# Patient Record
Sex: Male | Born: 1985 | Race: White | Hispanic: No | Marital: Married
Health system: Southern US, Community
[De-identification: ages and names within clinical notes are randomized; demographics above are authoritative.]

---

## 2019-12-17 ENCOUNTER — Other Ambulatory Visit: Payer: Self-pay

## 2019-12-17 ENCOUNTER — Emergency Department (HOSPITAL_COMMUNITY): Payer: BLUE CROSS/BLUE SHIELD

## 2019-12-17 ENCOUNTER — Encounter (HOSPITAL_COMMUNITY): Payer: Self-pay | Admitting: Emergency Medicine

## 2019-12-17 ENCOUNTER — Emergency Department (HOSPITAL_COMMUNITY)
Admission: EM | Admit: 2019-12-17 | Discharge: 2019-12-17 | Disposition: A | Discharge status: Home / Self Care | Payer: BLUE CROSS/BLUE SHIELD | Attending: Emergency Medicine | Admitting: Emergency Medicine

## 2019-12-17 DIAGNOSIS — R0602 Shortness of breath: Secondary | ICD-10-CM | POA: Insufficient documentation

## 2019-12-17 DIAGNOSIS — R079 Chest pain, unspecified: Secondary | ICD-10-CM | POA: Insufficient documentation

## 2019-12-17 DIAGNOSIS — F419 Anxiety disorder, unspecified: Secondary | ICD-10-CM | POA: Diagnosis not present

## 2019-12-17 LAB — BASIC METABOLIC PANEL
Anion gap: 13 (ref 5–15)
BUN: 11 mg/dL (ref 6–20)
CO2: 24 mmol/L (ref 22–32)
Calcium: 9.3 mg/dL (ref 8.9–10.3)
Chloride: 100 mmol/L (ref 98–111)
Creatinine, Ser: 0.97 mg/dL (ref 0.61–1.24)
GFR calc Af Amer: >60 mL/min (ref >60)
GFR calc non Af Amer: >60 mL/min (ref >60)
Glucose, Bld: 107 mg/dL — ABNORMAL HIGH (ref 70–99)
Potassium: 4.2 mmol/L (ref 3.5–5.1)
Sodium: 137 mmol/L (ref 135–145)

## 2019-12-17 LAB — CBC
HCT: 48.1 % (ref 39.0–52.0)
Hemoglobin: 16.1 g/dL (ref 13.0–17.0)
MCH: 30.6 pg (ref 26.0–34.0)
MCHC: 33.5 g/dL (ref 30.0–36.0)
MCV: 91.4 fL (ref 80.0–100.0)
Platelets: 180 10*3/uL (ref 150–400)
RBC: 5.26 MIL/uL (ref 4.22–5.81)
RDW: 12.4 % (ref 11.5–15.5)
WBC: 7.9 10*3/uL (ref 4.0–10.5)
nRBC: 0 % (ref 0.0–0.2)

## 2019-12-17 LAB — TROPONIN I (HIGH SENSITIVITY)
Troponin I (High Sensitivity): 3 ng/L (ref <18)
Troponin I (High Sensitivity): 3 ng/L (ref <18)

## 2019-12-17 MED ORDER — SODIUM CHLORIDE 0.9% FLUSH: 3.0000 mL | Freq: Once | INTRAVENOUS | Status: DC

## 2019-12-17 NOTE — ED Provider Notes (Signed)
[facility] EMERGENCY DEPARTMENT Provider Note   CSN: [id number] Arrival date & time: 12/17/19  1223     History No chief complaint on file.   Henry Mack is a 34 y.o. 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  Patient states he was running around and began to feel chest discomfort.  Describes chest pain as a "weight on his chest".  Chest pain lasted 30 to 40 minutes associated with "faster breathing".  Patient states he was hot because he was in the kitchen.  Denies nausea and vomiting.  Denies recent illness.  Patient states he had a similar episode of chest pain roughly 10 years ago and was told by EMS that it was related to anxiety.  Patient states he was feeling slightly anxious prior to onset of chest pain.  Denies family history of early CAD.  Denies history of blood clots, recent surgeries, recent long immobilizations, and hormonal treatments.  Patient was given 324 ASA prior to arrival.  Patient denied sublingual nitroglycerin.  Patient is currently chest pain-free.  Denies relationship to exertion and change in position.  Denies recent illness.  History obtained from patient and past medical records. No interpreter used during encounter.      History reviewed. No pertinent past medical history.  There are no problems to display for this patient.   History reviewed. No pertinent surgical history.     No family history on file.  Social History   Tobacco Use  . Smoking status: Not on file  Substance Use Topics  . Alcohol use: Not on file  . Drug use: Not on file    Home Medications Prior to Admission medications   Not on File    Allergies    Clindamycin/lincomycin  Review of Systems   Review of Systems  Constitutional: Negative for chills and fever.  Respiratory: Positive for  shortness of breath (admits to breathing faster).   Cardiovascular: Positive for chest pain. Negative for leg swelling.  Gastrointestinal: Negative for abdominal pain, nausea and vomiting.  All other systems reviewed and are negative.   Physical Exam Updated Vital Signs BP (!) 140/92   Pulse 78   Temp 98.6 F (37 C) (Oral)   Resp 12   Ht 5\' 8"  (1.727 m)   Wt 73.5 kg   SpO2 99%   BMI 24.63 kg/m   Physical Exam Vitals and nursing note reviewed.  Constitutional:      General: He is not in acute distress.    Appearance: He is not ill-appearing.  HENT:     Head: Normocephalic.  Eyes:     Pupils: Pupils are equal, round, and reactive to light.  Cardiovascular:     Rate and Rhythm: Normal rate and regular rhythm.     Pulses: Normal pulses.     Heart sounds: Normal heart sounds. No murmur heard.  No friction rub. No gallop.   Pulmonary:     Effort: Pulmonary effort is normal.     Breath sounds: Normal breath sounds.  Abdominal:     General: Abdomen is flat. There is no distension.     Palpations: Abdomen is soft.     Tenderness: There is no abdominal tenderness. There is no guarding or rebound.  Musculoskeletal:     Cervical back: Neck supple.  Comments: No lower extremity edema.  Negative calf tenderness bilaterally.  Negative Homan sign bilaterally.  Skin:    General: Skin is warm and dry.  Neurological:     General: No focal deficit present.     Mental Status: He is alert.  Psychiatric:        Mood and Affect: Mood normal.        Behavior: Behavior normal.     ED Results / Procedures / Treatments   Labs (all labs ordered are listed, but only abnormal results are displayed) Labs Reviewed  BASIC METABOLIC PANEL - Abnormal; Notable for the following components:      Result Value   Glucose, Bld 107 (*)    All other components within normal limits  CBC  TROPONIN I (HIGH SENSITIVITY)  TROPONIN I (HIGH SENSITIVITY)    EKG EKG  Interpretation  Date/Time:  Monday December 17 2019 12:29:05 EDT Ventricular Rate:  66 PR Interval:    QRS Duration: 92 QT Interval:  381 QTC Calculation: 400 R Axis:   88 Text Interpretation: Sinus rhythm ST elev, probable normal early repol pattern Confirmed by Bethann Berkshire [phone]) on 12/17/2019 1:43:48 PM   Radiology DG Chest 2 View  Result Date: 12/17/2019 CLINICAL DATA:  Chest pain EXAM: CHEST - 2 VIEW COMPARISON:  None. FINDINGS: The heart size and mediastinal contours are within normal limits. Both lungs are clear. No acute osseous abnormality. IMPRESSION: No active cardiopulmonary disease. Electronically Signed   By: Stana Bunting M.D.   On: 12/17/2019 13:16    Procedures Procedures (including critical care time)  Medications Ordered in ED Medications  sodium chloride flush (NS) 0.9 % injection 3 mL (has no administration in time range)    ED Course  I have reviewed the triage vital signs and the nursing notes.  Pertinent labs & imaging results that were available during my care of the patient were reviewed by me and considered in my medical decision making (see chart for details).    MDM Rules/Calculators/A&P                         34 year old male presents to the ED due to central, nonradiating, chest pain that started this morning around 10:30 AM and lasted 30 to 40 minutes.  Patient has no medical conditions.  Patient notes he had a similar episode roughly 10 years ago and was told it was possibly due to anxiety.  Patient admits to feeling anxious prior to onset of chest pain.  On arrival, patient is afebrile, not tachycardic or hypoxic.  Blood pressure mildly elevated at 163/88.  Patient notes this is typical for him.  Patient is currently chest pain-free.  Patient nontoxic-appearing.  Physical exam reassuring.  No anterior chest wall tenderness.  No lower extremity edema.  Routine labs, troponin, chest x-ray, and EKG ordered at triage to rule out cardiac etiology.   PERC negative and low risk using Wells criteria.  Low suspicion for PE/DVT.  Initial troponin normal at 3.  Will obtain delta troponin to rule out ACS.  CBC unremarkable with no leukocytosis and normal hemoglobin.  BMP reassuring with mild hyperglycemia at 107, but otherwise unremarkable.  Normal renal function and no electrolyte derangements.  Chest x-ray personally reviewed which is negative for signs of pneumonia, pneumothorax, or widened mediastinum.  EKG personally reviewed which demonstrates normal sinus rhythm with possible early repolarization.  No signs of acute ischemia.  Delta troponin flat.  Low suspicion for  ACS. Presentation non-concerning for dissection.  Advised patient to follow-up with PCP within the next week for further evaluation of chest pain.  Patient has remained chest pain-free his entire ED stay. Strict ED precautions discussed with patient. Patient states understanding and agrees to plan. Patient discharged home in no acute distress and stable vitals.  Final Clinical Impression(s) / ED Diagnoses Final diagnoses:  Nonspecific chest pain    Rx / DC Orders ED Discharge Orders    None       Jesusita Oka 12/17/19 1735    Bethann Berkshire, MD 12/18/19 [phone]

## 2019-12-17 NOTE — Discharge Instructions (Addendum)
As discussed, all of your labs were reassuring today.  Your cardiac markers were normal.  Please follow-up with your PCP with further evaluations of your chest pain.  Return to the ER for new or worsening symptoms.

## 2019-12-17 NOTE — ED Triage Notes (Signed)
Pt here ems from work. Chest discomfort that didn't resolve after resting. CO of center non radiating heavy chest pain. 160/80, 154/101 EKG peaked T waves. 324 aspirin and denied nitro. Pain is at a 2. 98% RA. No cardiac history.

## 2019-12-17 NOTE — ED Notes (Signed)
Patient verbalizes understanding of discharge instructions. Opportunity for questioning and answers were provided. Armband removed by staff, pt discharged from ED.  

## 2020-02-04 ENCOUNTER — Encounter (HOSPITAL_BASED_OUTPATIENT_CLINIC_OR_DEPARTMENT_OTHER): Payer: Self-pay | Admitting: *Deleted

## 2020-02-04 ENCOUNTER — Emergency Department (HOSPITAL_BASED_OUTPATIENT_CLINIC_OR_DEPARTMENT_OTHER)
Admission: EM | Admit: 2020-02-04 | Discharge: 2020-02-04 | Disposition: A | Discharge status: Home / Self Care | Payer: BLUE CROSS/BLUE SHIELD | Attending: Emergency Medicine | Admitting: Emergency Medicine

## 2020-02-04 ENCOUNTER — Emergency Department (HOSPITAL_BASED_OUTPATIENT_CLINIC_OR_DEPARTMENT_OTHER): Payer: BLUE CROSS/BLUE SHIELD

## 2020-02-04 ENCOUNTER — Other Ambulatory Visit: Payer: Self-pay

## 2020-02-04 DIAGNOSIS — W010XXA Fall on same level from slipping, tripping and stumbling without subsequent striking against object, initial encounter: Secondary | ICD-10-CM | POA: Insufficient documentation

## 2020-02-04 DIAGNOSIS — Y9389 Activity, other specified: Secondary | ICD-10-CM | POA: Insufficient documentation

## 2020-02-04 DIAGNOSIS — Y9289 Other specified places as the place of occurrence of the external cause: Secondary | ICD-10-CM | POA: Insufficient documentation

## 2020-02-04 DIAGNOSIS — S60921A Unspecified superficial injury of right hand, initial encounter: Secondary | ICD-10-CM | POA: Diagnosis present

## 2020-02-04 DIAGNOSIS — S62326A Displaced fracture of shaft of fifth metacarpal bone, right hand, initial encounter for closed fracture: Secondary | ICD-10-CM | POA: Diagnosis not present

## 2020-02-04 DIAGNOSIS — Y999 Unspecified external cause status: Secondary | ICD-10-CM | POA: Insufficient documentation

## 2020-02-04 DIAGNOSIS — S62339A Displaced fracture of neck of unspecified metacarpal bone, initial encounter for closed fracture: Secondary | ICD-10-CM

## 2020-02-04 MED ORDER — HYDROCODONE-ACETAMINOPHEN 5-325 MG PO TABS: 1.0000 | ORAL_TABLET | ORAL | 0 refills | Status: AC | PRN

## 2020-02-04 MED ORDER — HYDROCODONE-ACETAMINOPHEN 5-325 MG PO TABS
1.0000 | ORAL_TABLET | Freq: Once | ORAL | Status: AC
  Administered 2020-02-04: 1 via ORAL
  Filled 2020-02-04: qty 1

## 2020-02-04 NOTE — Discharge Instructions (Addendum)
Follow up with Orthopedics  Return for new or worsening symptoms 

## 2020-02-04 NOTE — ED Triage Notes (Signed)
C/o right hand injury x 1 day ago

## 2020-02-04 NOTE — ED Provider Notes (Signed)
[facility] EMERGENCY DEPARTMENT Provider Note   CSN: [id number] Arrival date & time: 02/04/20  1517    History Chief Complaint  Patient presents with  . Hand Injury    Henry Mack is a 34 y.o. male with past medical history who presents for evaluation of right hand pain.  Patient states next-door neighbors dog got loose and was attacking his cat.  Patient states he fell on a closed fist.  Has had pain and swelling to the right ulnar aspect of his hand.  Pain does not extend into his wrist, radius or ulna.  Has increased pain when making a fist.  He is not take anything for his symptoms.  He did not get bit by the dog.  No open lesions.  Started to get some bruising.  Denies hitting his head, LOC or anticoagulation.  No decreased range of motion, paresthesias, redness, warmth.  Rates his pain a 6/10.  Denies additional aggravating or alleviating factors.  History obtained from patient and past medical records.  No interpreter is used.  Has not been seen previously by orthopedics however has friends have been seen at the orthopedist office by [facility] who would like to be seen there  HPI     History reviewed. No pertinent past medical history.  There are no problems to display for this patient.   History reviewed. No pertinent surgical history.     No family history on file.  Social History   Tobacco Use  . Smoking status: Not on file  Substance Use Topics  . Alcohol use: Not on file  . Drug use: Not on file    Home Medications Prior to Admission medications   Medication Sig Start Date End Date Taking? Authorizing Provider  HYDROcodone-acetaminophen (NORCO/VICODIN) 5-325 MG tablet Take 1 tablet by mouth every 4 (four) hours as needed. 02/04/20   Alben Jepsen A, PA-C    Allergies    Clindamycin/lincomycin  Review of Systems   Review of Systems  Constitutional: Negative.   HENT: Negative.   Respiratory: Negative.   Cardiovascular: Negative.    Gastrointestinal: Negative.   Genitourinary: Negative.   Musculoskeletal:       Right hand pain  Neurological: Negative.   All other systems reviewed and are negative.   Physical Exam Updated Vital Signs BP 132/85   Pulse 75   Temp 98.3 F (36.8 C) (Oral)   Resp 18   Ht 5\' 8"  (1.727 m)   Wt 70.3 kg   SpO2 99%   BMI 23.57 kg/m   Physical Exam Vitals and nursing note reviewed.  Constitutional:      General: He is not in acute distress.    Appearance: He is well-developed. He is not ill-appearing, toxic-appearing or diaphoretic.  HENT:     Head: Normocephalic and atraumatic.  Eyes:     Pupils: Pupils are equal, round, and reactive to light.  Cardiovascular:     Rate and Rhythm: Normal rate and regular rhythm.  Pulmonary:     Effort: Pulmonary effort is normal. No respiratory distress.     Breath sounds: Normal breath sounds.  Abdominal:     General: Bowel sounds are normal. There is no distension.     Palpations: Abdomen is soft.  Musculoskeletal:     Right elbow: Normal.     Left elbow: Normal.     Right forearm: Normal.     Left forearm: Normal.     Right wrist: Normal.     Left  wrist: Normal.     Right hand: Swelling, tenderness and bony tenderness present. Decreased range of motion.     Left hand: Normal.       Hands:     Cervical back: Normal range of motion and neck supple.     Comments: Tenderness over fifth metacarpal on right upper extremity.  There is some soft tissue swelling and ecchymosis.  No swelling to digits.  Patient nontender to radius and ulna.  Nontender over scaphoid.  Full range of motion however pain with grip to right hand.  Skin:    General: Skin is warm and dry.     Capillary Refill: Capillary refill takes less than 2 seconds.     Comments: No contusions, abrasions, erythema, warmth.  Neurological:     Mental Status: He is alert.     Cranial Nerves: Cranial nerves are intact.     Sensory: Sensation is intact.     Motor: Motor  function is intact.     Coordination: Coordination is intact.     Comments: 5/5 grip strength bilaterally however pain to right grip.  Able to pronate, supinate without difficulty.     ED Results / Procedures / Treatments   Labs (all labs ordered are listed, but only abnormal results are displayed) Labs Reviewed - No data to display  EKG None  Radiology DG Hand Complete Right  Result Date: 02/04/2020 CLINICAL DATA:  Injury, right hand pain and bruising EXAM: RIGHT HAND - COMPLETE 3+ VIEW COMPARISON:  None. FINDINGS: Three view radiograph right hand demonstrates a transverse fracture of the distal left fifth metacarpal metadiaphysis with moderate volar angulation of the distal fracture fragment. Right fifth MCP articulation is preserved. No other fracture or dislocation. Extensive surrounding soft tissue swelling noted. IMPRESSION: Distal left fifth metacarpal extra-articular fracture with moderate volar angulation. Electronically Signed   By: Helyn Numbers MD   On: 02/04/2020 15:56    Procedures .Splint Application  Date/Time: 02/04/2020 9:26 PM Performed by: Linwood Dibbles, PA-C Authorized by: Linwood Dibbles, PA-C   Consent:    Consent obtained:  Verbal   Consent given by:  Patient   Risks discussed:  Discoloration, numbness, pain and swelling   Alternatives discussed:  Referral, observation, alternative treatment, delayed treatment and no treatment Pre-procedure details:    Sensation:  Normal Procedure details:    Laterality:  Right   Location:  Hand   Hand:  R hand   Strapping: no     Cast type:  Short arm (Ulnar gutter)   Supplies:  Aluminum splint, cotton padding and elastic bandage Post-procedure details:    Pain:  Improved   Sensation:  Normal   Patient tolerance of procedure:  Tolerated well, no immediate complications Reduction of fracture  Date/Time: 02/04/2020 9:26 PM Performed by: Linwood Dibbles, PA-C Authorized by: Linwood Dibbles, PA-C    Preparation: Patient was prepped and draped in the usual sterile fashion. Local anesthesia used: no  Anesthesia: Local anesthesia used: no  Sedation: Patient sedated: no  Patient tolerance: patient tolerated the procedure well with no immediate complications    (including critical care time)  Medications Ordered in ED Medications  HYDROcodone-acetaminophen (NORCO/VICODIN) 5-325 MG per tablet 1 tablet (1 tablet Oral Given 02/04/20 2043)    ED Course  I have reviewed the triage vital signs and the nursing notes.  Pertinent labs & imaging results that were available during my care of the patient were reviewed by me and considered in my medical  decision making (see chart for details).  34 patient presents for right hand pain after fall while trying to separate his cat from dog.  No contusions, abrasions or lacerations.  Patient neurovascularly intact.  There is soft tissue swelling and tenderness to his fifth metacarpal to his right upper extremity.  No bony tenderness to digits, radius or ulna.  X-ray imaging obtained from triage shows distal metacarpal fracture with volar angulation.  Patient pain controlled.  Able to place a splint as well as try and reduce fracture.  Patient does not want to wait for post reduction films.  I discussed symptomatic management and close follow-up with orthopedics.  He is neurovascularly intact after splint placement.  Will DC home with close orthopedic follow-up.  The patient has been appropriately medically screened and/or stabilized in the ED. I have low suspicion for any other emergent medical condition which would require further screening, evaluation or treatment in the ED or require inpatient management.  Patient is hemodynamically stable and in no acute distress.  Patient able to ambulate in department prior to ED.  Evaluation does not show acute pathology that would require ongoing or additional emergent interventions while in the emergency  department or further inpatient treatment.  I have discussed the diagnosis with the patient and answered all questions.  Pain is been managed while in the emergency department and patient has no further complaints prior to discharge.  Patient is comfortable with plan discussed in room and is stable for discharge at this time.  I have discussed strict return precautions for returning to the emergency department.  Patient was encouraged to follow-up with PCP/specialist refer to at discharge.    MDM Rules/Calculators/A&P                           Final Clinical Impression(s) / ED Diagnoses Final diagnoses:  Closed boxer's fracture, initial encounter    Rx / DC Orders ED Discharge Orders         Ordered    HYDROcodone-acetaminophen (NORCO/VICODIN) 5-325 MG tablet  Every 4 hours PRN        02/04/20 2109           Kingston Guiles A, PA-C 02/04/20 2126    Tegeler, Canary Brim, MD 02/04/20 2234

## 2020-02-04 NOTE — ED Notes (Signed)
PA Henderly looked at splint

## 2021-11-19 IMAGING — DX DG HAND COMPLETE 3+V*R*
3 series · 3 of 3 positions shown · non-contrast
Comparison: None.

CLINICAL DATA: Injury, right hand pain and bruising

EXAM:
RIGHT HAND - COMPLETE 3+ VIEW

[hand pa]
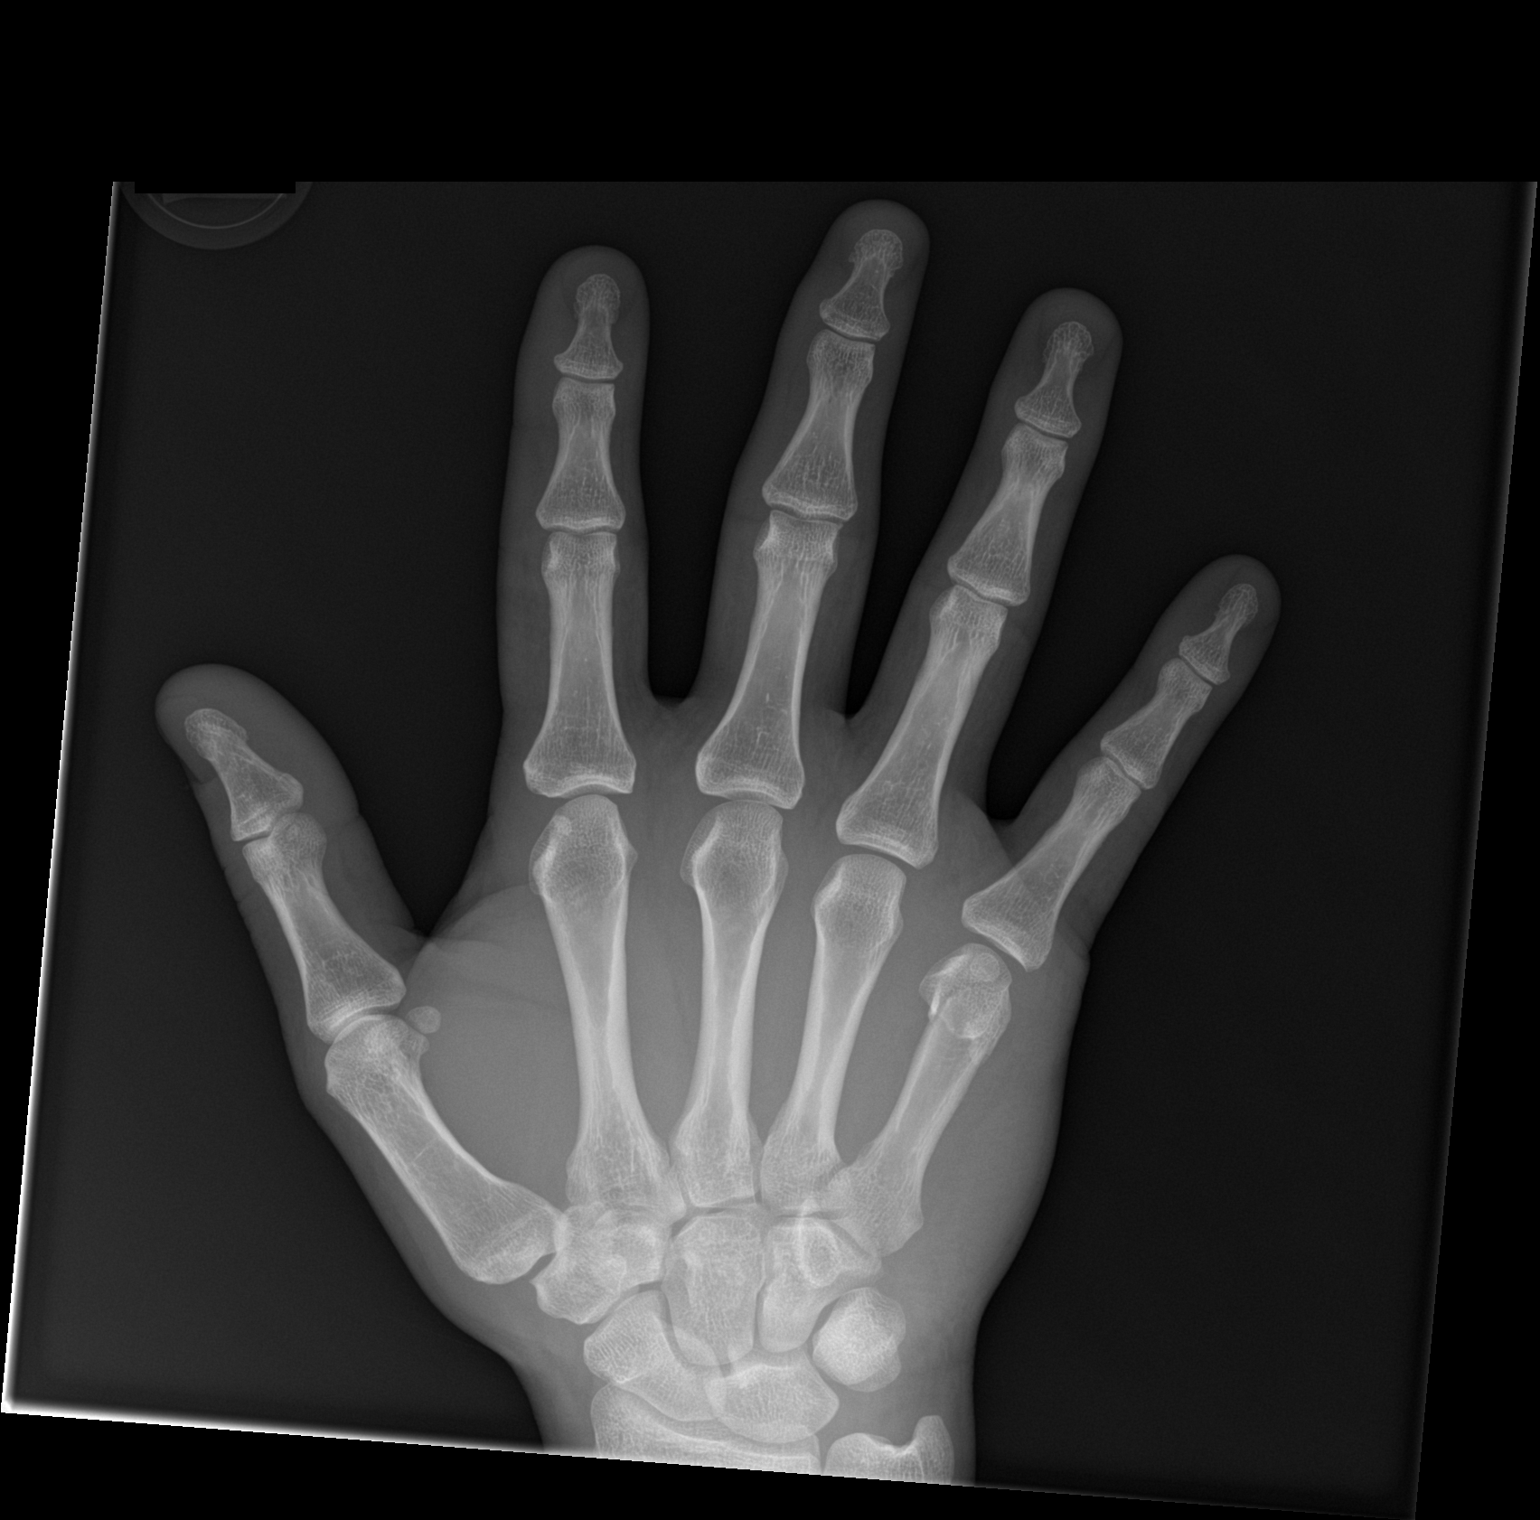

[hand obl]
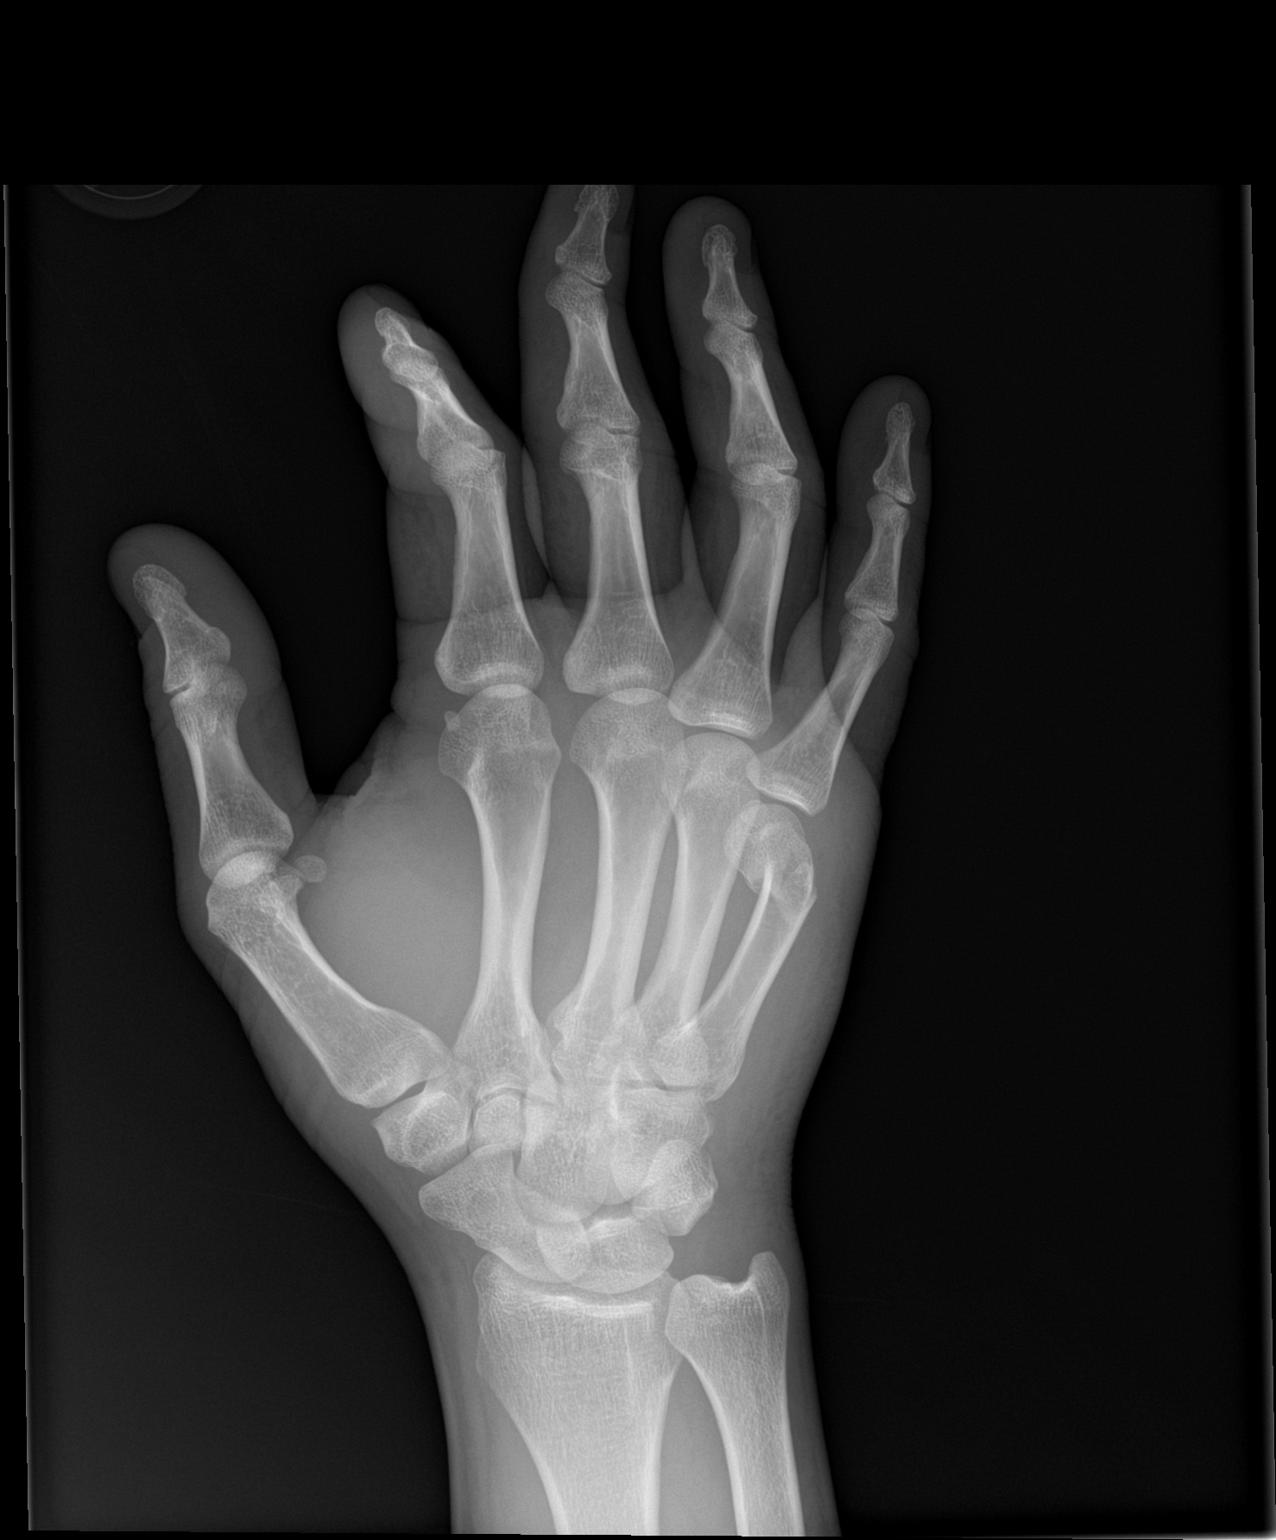

[hand lat]
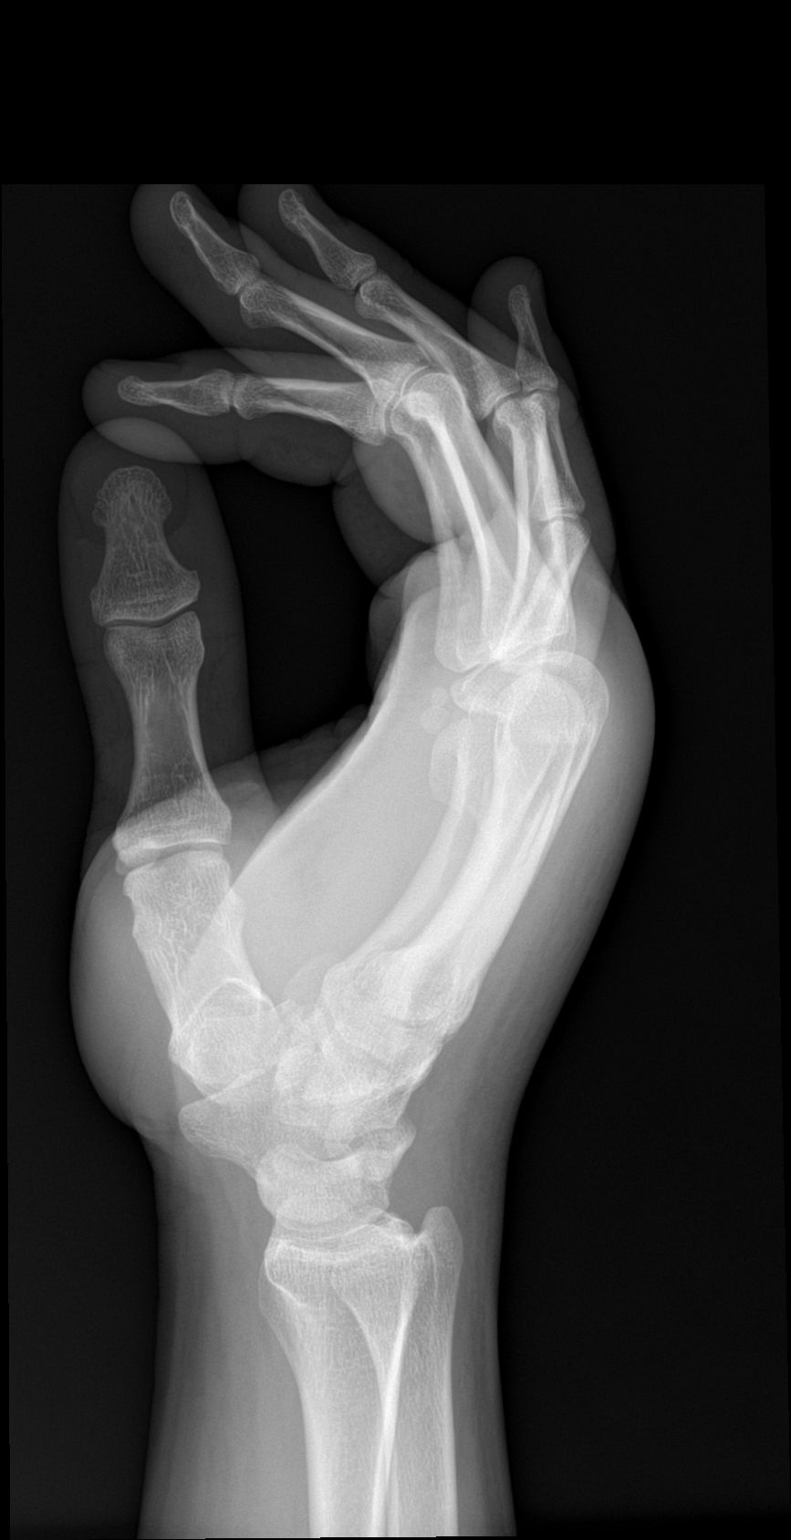

[3 of 3 positions shown; findings below may reference images not displayed]

FINDINGS: Three view radiograph right hand demonstrates a transverse fracture
of the distal left fifth metacarpal metadiaphysis with moderate
volar angulation of the distal fracture fragment. Right fifth MCP
articulation is preserved. No other fracture or dislocation.
Extensive surrounding soft tissue swelling noted.
IMPRESSION: Distal left fifth metacarpal extra-articular fracture with moderate
volar angulation.
